# Patient Record
Sex: Male | Born: 1955 | Race: White | Hispanic: No | Marital: Single
Health system: Southern US, Community
[De-identification: ages and names within clinical notes are randomized; demographics above are authoritative.]

---

## 2012-04-17 ENCOUNTER — Observation Stay: Payer: Self-pay | Admitting: Internal Medicine

## 2012-04-17 LAB — CBC
HCT: 43.4 % (ref 40.0–52.0)
HGB: 14.6 g/dL (ref 13.0–18.0)
MCH: 30.8 pg (ref 26.0–34.0)
MCHC: 33.7 g/dL (ref 32.0–36.0)
MCV: 91 fL (ref 80–100)
Platelet: 546 10*3/uL — ABNORMAL HIGH (ref 150–440)
RDW: 14 % (ref 11.5–14.5)
WBC: 12.3 10*3/uL — ABNORMAL HIGH (ref 3.8–10.6)

## 2012-04-17 LAB — COMPREHENSIVE METABOLIC PANEL
Albumin: 5 g/dL (ref 3.4–5.0)
Alkaline Phosphatase: 117 U/L (ref 50–136)
Anion Gap: 3 — ABNORMAL LOW (ref 7–16)
BUN: 20 mg/dL — ABNORMAL HIGH (ref 7–18)
Bilirubin,Total: 0.5 mg/dL (ref 0.2–1.0)
Chloride: 109 mmol/L — ABNORMAL HIGH (ref 98–107)
Creatinine: 0.94 mg/dL (ref 0.60–1.30)
EGFR (African American): >60
EGFR (Non-African Amer.): >60
Glucose: 137 mg/dL — ABNORMAL HIGH (ref 65–99)
Osmolality: 284 (ref 275–301)
SGPT (ALT): 37 U/L (ref 12–78)
Sodium: 140 mmol/L (ref 136–145)

## 2012-04-18 LAB — CBC WITH DIFFERENTIAL/PLATELET
Basophil #: 0.1 10*3/uL (ref 0.0–0.1)
Basophil %: 1.2 %
HCT: 36.7 % — ABNORMAL LOW (ref 40.0–52.0)
HGB: 12.4 g/dL — ABNORMAL LOW (ref 13.0–18.0)
Lymphocyte %: 29.8 %
MCH: 30.8 pg (ref 26.0–34.0)
MCHC: 33.7 g/dL (ref 32.0–36.0)
Monocyte #: 1.1 x10 3/mm — ABNORMAL HIGH (ref 0.2–1.0)
Monocyte %: 9.3 %
Neutrophil #: 6.7 10*3/uL — ABNORMAL HIGH (ref 1.4–6.5)
Neutrophil %: 55.9 %
RBC: 4.01 10*6/uL — ABNORMAL LOW (ref 4.40–5.90)
RDW: 13.8 % (ref 11.5–14.5)

## 2012-04-18 LAB — BASIC METABOLIC PANEL
Anion Gap: 4 — ABNORMAL LOW (ref 7–16)
BUN: 12 mg/dL (ref 7–18)
Calcium, Total: 8.3 mg/dL — ABNORMAL LOW (ref 8.5–10.1)
EGFR (African American): >60
EGFR (Non-African Amer.): >60
Glucose: 83 mg/dL (ref 65–99)
Potassium: 3.8 mmol/L (ref 3.5–5.1)

## 2012-04-18 LAB — PROTEIN, TOTAL: Total Protein: 6.5 g/dL (ref 6.4–8.2)

## 2012-04-20 LAB — PROT IMMUNOELECTROPHORES(ARMC)

## 2014-05-05 NOTE — H&P (Signed)
PATIENT NAME:  Jacob [name], Jacob [name] MR#:  [id number] DATE OF BIRTH:  02-06-1955  DATE OF ADMISSION:  04/17/2012  PRIMARY CARE PHYSICIAN: None.  PSYCHIATRIST: Dr. Aron Baba in [place].   CHIEF COMPLAINT: Dysphagia.   HISTORY OF PRESENT ILLNESS: This is a 59 year old man who I saw in the endoscopy suite post procedure. He states that last night he was drinking Sprite. He went to sleep. This morning around 5:30 instead of drinking the Sprite ended up drinking pool cleaner instead. He states that this was not intentional. He is having dysphagia with swallowing, 9 out of 10 in intensity. Dr. Niel Hummer took him to the endoscopy suite and found nonbleeding erosive gastropathy. Mild esophageal edema could be grade 1 caustic injury or chronic GERD. Also, duodenal erosions without bleeding were seen. Dr. Niel Hummer recommended observing another 24 hours in the hospital, liquid diet and PPI, and hospitalist services were contacted for further evaluation.   PAST MEDICAL HISTORY: Depression.   PAST SURGICAL HISTORY: None.   ALLERGIES: No known drug allergies.   MEDICATIONS: The patient takes Dexedrine 10 mg 3 times a day.   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 He smokes 1 pack per day. Rare alcohol use. No drug use.   FAMILY HISTORY: Father died of dementia, had DVT. Mother, unclear medical history.  REVIEW OF SYSTEMS:   CONSTITUTIONAL: No fever, chills or sweats. No weight gain. No weight loss.  EYES: He does wear reading glasses.  EARS, NOSE, MOUTH AND THROAT: Positive for sore throat and dysphagia. No hearing loss. No runny nose.  CARDIOVASCULAR: No chest pain. No palpitations.  RESPIRATORY: No shortness of breath. No cough. No sputum. No hemoptysis.  GASTROINTESTINAL: No nausea. No vomiting. No abdominal pain. No diarrhea. No constipation. No bright red blood per rectum. No melena.   GENITOURINARY: No burning on urination. No hematuria.  MUSCULOSKELETAL: No  joint pain or muscle pain.  INTEGUMENT: No rashes or eruptions.  NEUROLOGIC: No fainting or blackouts.  PSYCHIATRIC: Positive for depression.  ENDOCRINE: No thyroid problems.  HEMATOLOGIC AND LYMPHATIC: No anemia. No easy bruising or bleeding   PHYSICAL EXAMINATION:  VITAL SIGNS: Temperature 98.4, pulse 68, blood pressure 130/83, pulse oximetry 100% on room air, respirations 17.  GENERAL: No respiratory distress.  EYES: Conjunctivae and lids normal. Pupils equal, round and reactive to light. Extraocular muscles intact. No nystagmus.  EARS, NOSE, MOUTH AND THROAT: Tympanic membranes: No erythema. Nasal mucosa: No erythema. Throat: No erythema, no exudate seen. Lips and gums: No lesions.  NECK: No JVD. No bruits. No lymphadenopathy. No thyromegaly. No thyroid nodules palpated.  LUNGS: Clear to auscultation. No use of accessory muscles to breathe. No rhonchi, rales or wheeze heard.  CARDIOVASCULAR: S1, S2 normal. No gallops, rubs or murmurs heard. Carotid upstroke 2+ bilaterally, no bruits. Dorsalis pedis pulses 2+ bilaterally. No edema of the lower extremity.  ABDOMEN: Soft, nontender. No organomegaly/splenomegaly. Normoactive bowel sounds. No masses felt.  LYMPHATIC: No lymph nodes in the neck.  MUSCULOSKELETAL: No clubbing, edema or cyanosis.  SKIN: No rashes or ulcers seen.  NEUROLOGIC: Cranial nerves II through XII grossly intact. Deep tendon reflexes are 2+ bilateral lower extremities.  PSYCHIATRIC: The patient is oriented to person, place and time. Very tangential with answers. Poor eye contact.   LABORATORY AND RADIOLOGICAL DATA: Chest x-ray showed mild hyperinflation consistent with COPD. Soft tissue neck: No acute abnormality. Glucose 137, BUN 20, creatinine 0.94, sodium 140, potassium 4.0, chloride 109, CO2 of 28, calcium 9.4.  Liver function tests: AST slightly elevated at 40, total protein slightly elevated at 9.1. White blood cell count 12.3, hemoglobin and hematocrit 14.6 and 43.4,  platelet count 546. Lipase 268.   ASSESSMENT AND PLAN:  1.  Dysphagia secondary to ingestion of pool cleaner: Found to have esophagitis, duodenitis and gastritis. Gastroenterology recommended 24-hour observation with intravenous Protonix, intravenous fluids, pain control, liquid diet. Dr. Niel HummerIftikhar will evaluate daily to see how he is doing.  2.  Depression: The patient ingested pool cleaner, unclear cause. The patient states that he is not suicidal. The patient is very tangential with answers. I will get a psychiatric consultation to evaluate further.  3.  Tobacco abuse: Smoking cessation counseling done 3 minutes by me. Nicotine patch as needed.  4.  Impaired fasting glucose: Will check a hemoglobin A1c.  5.  Elevated total protein: Likely secondary to dehydration but will give intravenous fluids. Recheck total protein tomorrow morning and send off a serum protein electrophoresis.   TIME SPENT ON ADMISSION: 55 minutes.   ____________________________ Herschell Dimesichard J. Renae GlossWieting, MD rjw:jm D: 04/17/2012 18:25:22 ET T: 04/17/2012 20:06:35 ET JOB#: [id number]  cc: Herschell Dimesichard J. Renae GlossWieting, MD, <Dictator> Dr. Aron BabaWojdynska Salley ScarletICHARD J Shantrell Placzek MD ELECTRONICALLY SIGNED 04/19/2012 15:11

## 2014-05-05 NOTE — Consult Note (Signed)
Brief Consult Note: Diagnosis: depression nos.   Patient was seen by consultant.   Consult note dictated.   Orders entered.   Comments: Psychiatry: Patient seen. Chart reviewed. Patient denies any suicidal ideation or that behavior was in any way intentional or represented suicide attempt. Does admit to depressive symptoms but denies being hopeless or having suicide hx or SA. Patient counceled on importance of following up with outpt treatment which he already has an appointment for. No indication for commitment or psych hospital stay. DCed the suicide precautions.  Electronic Signatures: Audery Amellapacs, Idaliz Tinkle T (MD)  (Signed 06-Apr-14 17:41)  Authored: Brief Consult Note   Last Updated: 06-Apr-14 17:41 by Audery Amellapacs, Shealyn Sean T (MD)

## 2014-05-05 NOTE — Consult Note (Signed)
Brief Consult Note: Diagnosis: Suspected caustic ingestion (accidental).   Patient was seen by consultant.   Consult note dictated.   Comments: Please see dictated consult. Thanks.  Electronic Signatures: Lurline DelIftikhar, Suliman Termini (MD)  (Signed 05-Apr-14 15:37)  Authored: Brief Consult Note   Last Updated: 05-Apr-14 15:37 by Lurline DelIftikhar, Velora Horstman (MD)

## 2014-05-05 NOTE — Discharge Summary (Signed)
PATIENT NAME:  Jacob [name], Jacob [name] MR#:  [id number] DATE OF BIRTH:  11-Mar-1955  DATE OF ADMISSION:  04/17/2012 DATE OF DISCHARGE:  04/18/2012 (Likely, pending psychiatric evaluation).   DISCHARGE DIAGNOSES:  1.  Dysphagia secondary to ingestion of caustic substance, the pool liquid.  2.  History of depression.   HOSPITAL COURSE:  The patient is a 59 year old male brought in and admitted for dysphagia.  The patient drank the pool liquid about 5:30 a.m. yesterday on April 5th, thinking that it was Sprite.  The patient had only one swallow and he had significant dysphagia.  He was brought into the hospital.  The patient had an EGD.  EGD showed no erythema, no ulcer.  The patient's mucosa appeared somewhat edematous, loss of normal (vascular  pattern secondary to some coating of esophagus due to recently ingested material.  The patient had a few localizing small nonbleeding erosions present in gastric antrum upper body.  Impression of EDG results was probable mild esophageal edema without significant erythema or ulcer consistent with grade 1 caustic injury or chronic GERD is a possibility.  The patient did receive IV fluids and started on viscous lidocaine along with PPIs and started on clear liquids yesterday after the procedure.  The patient actually felt better today, has no trouble swallowing and he is on full liquids.  The patient was seen by Dr. Niel HummerIftikhar who suggested that the patient is to be on full liquids for another 24 hours and follow with Dr Niel HummerIftikhar on Tuesday, that is April 8th at 3:00 p.m. regarding followup.  The patient is on 1:1 precaution because of the nature of presentation with caustic ingestion, but the patient said it was accidental and denies any suicidal or homicidal ideation.  We have asked for psych to see the patient.  If psychiatry clears the patient, he will be discharged home.  The patient will follow with Dr Unk PintoIfitkhar and also continue PPIs and full liquids.  The patient needs to be  on full liquids for another 24 hours.   DISCHARGE MEDICATIONS:  Protonix 40 mg p.o. b.i.[name].   Full liquid diet.   The patient will follow up with Dr. Niel HummerIftikhar on Tuesday.   DISCHARGE DIAGNOSES:  1.  Dysphagia secondary to ingestion of caustic liquid, which has resolved.  2.  Depression.    LABORATORY DATA:  The patient's white count was slightly up at 12.3 on admission but hemoglobin 14.6, hematocrit 43.4.  Electrolytes were stable, creatinine 0.94, BUN 20 on  admission.  Soft tissue of the neck showed no acute abnormalities.  He had a chest x-ray which showed mild hyperinflation consistent with COPD.  Today WBC 11.9.   Time spent on discharge of patient more than 30 minutes.  He will be discharged home once  psychiatry clears the patient.    ____________________________ Katha HammingSnehalatha Duru Reiger, MD [name]: 04/18/2012 12:43:03 ET T: 04/18/2012 18:16:09 ET JOB#: [id number]  cc: Katha HammingSnehalatha Lahoma Constantin, MD, <Dictator> Lurline DelShaukat Iftikhar, MD Katha HammingSNEHALATHA Rori Goar MD ELECTRONICALLY SIGNED 05/02/2012 20:07

## 2014-05-05 NOTE — Consult Note (Signed)
Chief Complaint:  Subjective/Chief Complaint Feels better with only minimal pain on swallowing. Tolerating liquids well. No SOB, abdominal pain or chest pain. No subcutaneous emphysema. No fever. Vitals stable. Leucocytosis improved.  Impressio: Accidental caustic ingestion. EGD with grade 1 esophageal injury (? edema). No evidence of ulceration etc.  Recommendations: Full liquid diet. If patient is discharged today, should stay on liquids for another 24 hours and follow up with me on Tuesday at 3pm. Discussed with Dr. Luberta MutterKonidena. DC on Protonix. If patient stays in the hospital, will follow.   Electronic Signatures: Lurline DelIftikhar, Terria Deschepper (MD)  (Signed 06-Apr-14 09:33)  Authored: Chief Complaint   Last Updated: 06-Apr-14 09:33 by Lurline DelIftikhar, Akeel Reffner (MD)

## 2014-05-05 NOTE — Consult Note (Signed)
DATE OF BIRTH:  28-Nov-1955  DATE OF CONSULTATION:  04/17/2012  CONSULTING PHYSICIAN:  Lurline Del, MD  REASON FOR CONSULTATION:  Accident ingestion of caustic material, dysphagia.   HISTORY OF PRESENT ILLNESS: This is a 59 year old male with history of ADD. Apparently patient mistakenly swallowed a small amount of a pool algae cleaner, according to him. It tasted like Clorox. He swallowed maybe about half an ounce, according to him, and he spit out the rest. There was no suicidal attempt or ideation. He did not have any immediate problem, although later on he started to have discomfort in the back of the throat only on swallowing. He denies any shortness of breath, cough, chest pain, fever, chills or any other significant symptoms. The algae site was evaluated by Dr. Enedina Finner in [facility], and according to Dr. Enedina Finner, it contained a strong alkaline substance. Poison Control was apparently contacted, and an upper GI endoscopy was recommended to assess the extent of esophageal injury. As mentioned above, the patient denies any abdominal pain, chest pain, etc. The patient was evaluated and just underwent an upper GI endoscopy. Please see the complete EGD report for description.   PAST MEDICAL HISTORY: Significant for ADD, according to him, patient has had tonsillectomy in the past.  Past medical history is otherwise quite insignificant.   ALLERGIES:  None.   SOCIAL HISTORY:  He smokes about a pack a day.   FAMILY HISTORY:  Unremarkable.   REVIEW OF SYSTEMS: Grossly negative, except for what is mentioned in the History of Present Illness.   PHYSICAL EXAMINATION: GENERAL:  A fairly well-built male who does not appear to be in any acute distress. Quite awake and alert.  VITAL SIGNS:  His vitals are fairly stable, and he has been afebrile.  NECK:  Neck veins are flat. No subcutaneous emphysema was noted.  LUNGS:  Clear to auscultation bilaterally, with good air entry and no added sounds.   CARDIOVASCULAR EXAM: Regular rate and rhythm. No gallops or murmurs. No subcutaneous emphysema was noted on palpation of the chest wall as well.  ABDOMEN:  Soft and benign, flat, nontender, nondistended. No rebound or guarding was noted.  NEUROLOGICAL:  Examination appears to be unremarkable.   LABS:  His electrolytes are unremarkable. BUN is 20, creatinine is normal at 0.94. Serum lipase is normal. White cell count is slightly elevated at 12.3. The rest of the CBC is within normal limits. Soft tissue neck x-ray is negative.   ASSESSMENT AND PLAN:  Patient with accidental ingestion of a small amount of caustic substance. He is complaining of severe throat discomfort on swallowing. The patient has no shortness of breath, chest pain, abdominal pain, nausea, vomiting, or any other significant GI symptoms. An urgent upper GI endoscopy was planned and done. The patient does have some chronic GI changes such as gastric and duodenal erosions, which are probably not related to caustic ingestion. Esophagus appeared somewhat abnormal because of recent ingestion of Pepsi, but esophageal mucosal edema cannot be completely ruled out. No significant erythema or ulcerations were noted. Mild erythema was noted in the back of the throat. Due to these findings and probable grade 1 caustic injury to the esophagus. I would recommend observing him for 24 to 48 hours in the hospital. The patient can have only clear liquid diet. Start him on Protonix 40 mg IV b.i.d. These suggestions were discussed with Dr. Enedina Finner, who will get in touch with hospitalist service for admission. Repeat CBC in the morning. Further recommendations  depending on the patient's hospital course    ____________________________ Lurline DelShaukat Racquel Arkin, MD si:mr D: 04/17/2012 15:42:03 ET T: 04/17/2012 16:48:23 ET JOB#: [id number]  cc: Lurline DelShaukat Kedrick Mcnamee, MD, <Dictator> Lurline DelSHAUKAT Summerlynn Glauser MD ELECTRONICALLY SIGNED 04/18/2012 17:28

## 2014-05-05 NOTE — Consult Note (Signed)
PATIENT NAME:  Jacob [name], Jacob [name] MR#:  [id number] DATE OF BIRTH:  [birth year]  DATE OF CONSULTATION:  04/18/2012  CONSULTING PHYSICIAN:  Audery Amel, MD  IDENTIFYING INFORMATION AND REASON FOR CONSULT: A 59 year old man admitted to the hospital after ingestion of a caustic substance.  Consult is because of possibility of suicidal behavior.   HISTORY OF PRESENT ILLNESS: Information is obtained from the patient and the chart. The patient yesterday presented to the hospital after having ingested a caustic substance which it appears was some kind of swimming poor cleaner product. The patient was complaining of burning in his throat. The patient's story throughout this episode has been that he was sleeping and got up probably sleepwalking or partially sleepwalking and accidentally drank some of this bottle of toxic substance. He reports that he only drank about mouthful of it before feeling what it was in the back of his throat, spitting it out and calling his daughter, who was in the house. He denies that he in any way did it intentionally. He tells me today that he was asleep, and the first thing he remembers is suddenly coming to with this caustic substance in his mouth. The substance was apparently stored underneath the sink at the patient's daughter's home. The patient was staying at his daughter's house where he had come to help her out with some household work for a couple of days. The patient admits that his mood has been dysphoric and down and has been like that for probably most of a year or so. He says that overall it has probably been a little bit better than it used to be, and he is still able to enjoy normal things in his life. He denies any difficulty with sleep or appetite.  He denies that he has been having any suicidal ideation. He denies that he has had any sense of hopelessness or helplessness. He was taking an antidepressant, Celexa or Cymbalta, I am not sure which from his history, which he  stopped about 3 weeks ago. He also reports that in the past he has taken Prozac and Paxil. He says he does not think that the Cymbalta was of any help to him, so he stopped it. He says that he does still have an appointment to see a mental health provider coming up in a few weeks. He completely denies any recent suicidal ideation.   PAST PSYCHIATRIC HISTORY: He reports that he has had problems with depression in the past but has never been hospitalized for it. He has been tried on a couple of antidepressants, including Prozac and Paxil as well as Cymbalta, possibly Celexa, but he says none of them have clearly been of any benefit. He denies ever having tried to kill himself in the past, denies any history of violence.   SOCIAL HISTORY: The patient lives with his brother. The patient is not currently working, having been laid off from his job about a year ago. He has been living on his inheritance. His father passed away somewhere between a year and 2 years ago, it seems, and left the patient some money which he has been living on. The death of his father was also a major emotional blow, and the patient feels like that has also set off some of his depression. He has 3 adult children, 1 of them a daughter here in [place] who he had come to stay with for a couple of days.   PAST MEDICAL HISTORY: He had some burning to  his GI tract from this substance but looks like he will make a full recovery. He claims that he does not have any other ongoing medical problems.   SUBSTANCE ABUSE HISTORY: He says he drinks 1 or 2 beers every few days but denies that he has been increasing the amount, and he denies that it has been a problem for him. He denies that he was drinking when this happened. He denies any past history of substance abuse.   REVIEW OF SYSTEMS: Currently feels frustrated at having to wait for the psychiatry evaluation. He denies feeling severely depressed. He denies hallucinations or delusions, denies  suicidal or homicidal ideation. He still has some soreness in his throat but has been able to get down some liquids today.   MENTAL STATUS EXAM: A somewhat disheveled man interviewed in a hospital bed. He was mostly cooperative with the interview, although he did seem frustrated and made a lot of protests and complaints that he thought it was silly that he was being evaluated. Eye contact was intermittent. Psychomotor activity fidgety. Mood somewhat irritable and dysphoric. Not tearful. Thoughts appear generally lucid. No sign of delusional thinking. Denies auditory or visual hallucinations. Denies any suicidal or homicidal ideation. Can name several things in life that are positive for him that he is looking forward to. Baseline intelligence appears to be normal. Current judgment and insight probably back to normal baseline. Memory, short term and long-term, adequate.   LABORATORY DATA: Unfortunately, no alcohol or drug level was done at the time of admission, so I do not know whether he was intoxicated at the time.   ASSESSMENT: A 59 year old man who swallowed a toxic substance but then immediately sought help for it, did not swallow enough to do himself any serious damage. He admits that the whole story sounds odd, but he indicates that he thinks that he was sleepwalking through the whole thing. He does have a history of depression and is probably still having a depression going on but completely denies any suicidal ideation. I tried to speak to his daughter on the phone but was not able to reach her, but I did get to overhear both sides of a phone conversation the patient had with his daughter in which she clearly said that she did not think he was suicidal and did not think that he was dangerous. At this point, we do not have any reason to think strongly that the patient is suicidal or acutely dangerous to himself. He does not meet commitment criteria. He does need to have mental health follow-up.    TREATMENT PLAN: I educated the patient about the nature of major depression, and the importance of getting it treated, and the risks of not doing so. I strongly encouraged him to follow up with his mental health provider after leaving the hospital. I explained how medication or therapy could be helpful for him.  I encouraged him to please reach out and contact someone if he were to ever have any thoughts about harming himself. I ascertained with him that he has no guns at home, and he does not think his daughter does either. At this point, no indication for psychiatric hospitalization. No reason to start new medicine. Recommend discharge from the hospital.   DIAGNOSIS, PRINCIPAL AND PRIMARY:  AXIS I: Major depression, mild-to-moderate, recurrent.   SECONDARY DIAGNOSES: AXIS I: No further.   AXIS II: Deferred.   AXIS III: Burns, status post ingestion of an alkali cleaner.   AXIS IV: Moderate,  chronic stress from joblessness and death of his father, which he is still mourning.   AXIS V: Functioning at time of evaluation 62.    ____________________________ Audery AmelJohn T. Kaoir Loree, MD jtc:cb D: 04/18/2012 17:55:29 ET T: 04/18/2012 20:01:26 ET JOB#: [id number]  cc: Audery AmelJohn T. Elyana Grabski, MD, <Dictator> Audery AmelJOHN T Haset Oaxaca MD ELECTRONICALLY SIGNED 04/18/2012 21:52

## 2014-09-30 IMAGING — CR NECK SOFT TISSUES - 1+ VIEW
1 series · 2 of 2 positions shown · non-contrast
Comparison: none

REASON FOR EXAM: dysphagia alkali ingestion
COMMENTS:

[Series 1: w soft tissue neck lat · 0.14mm/px · 2 of 2 slices shown]
[im 1/2]
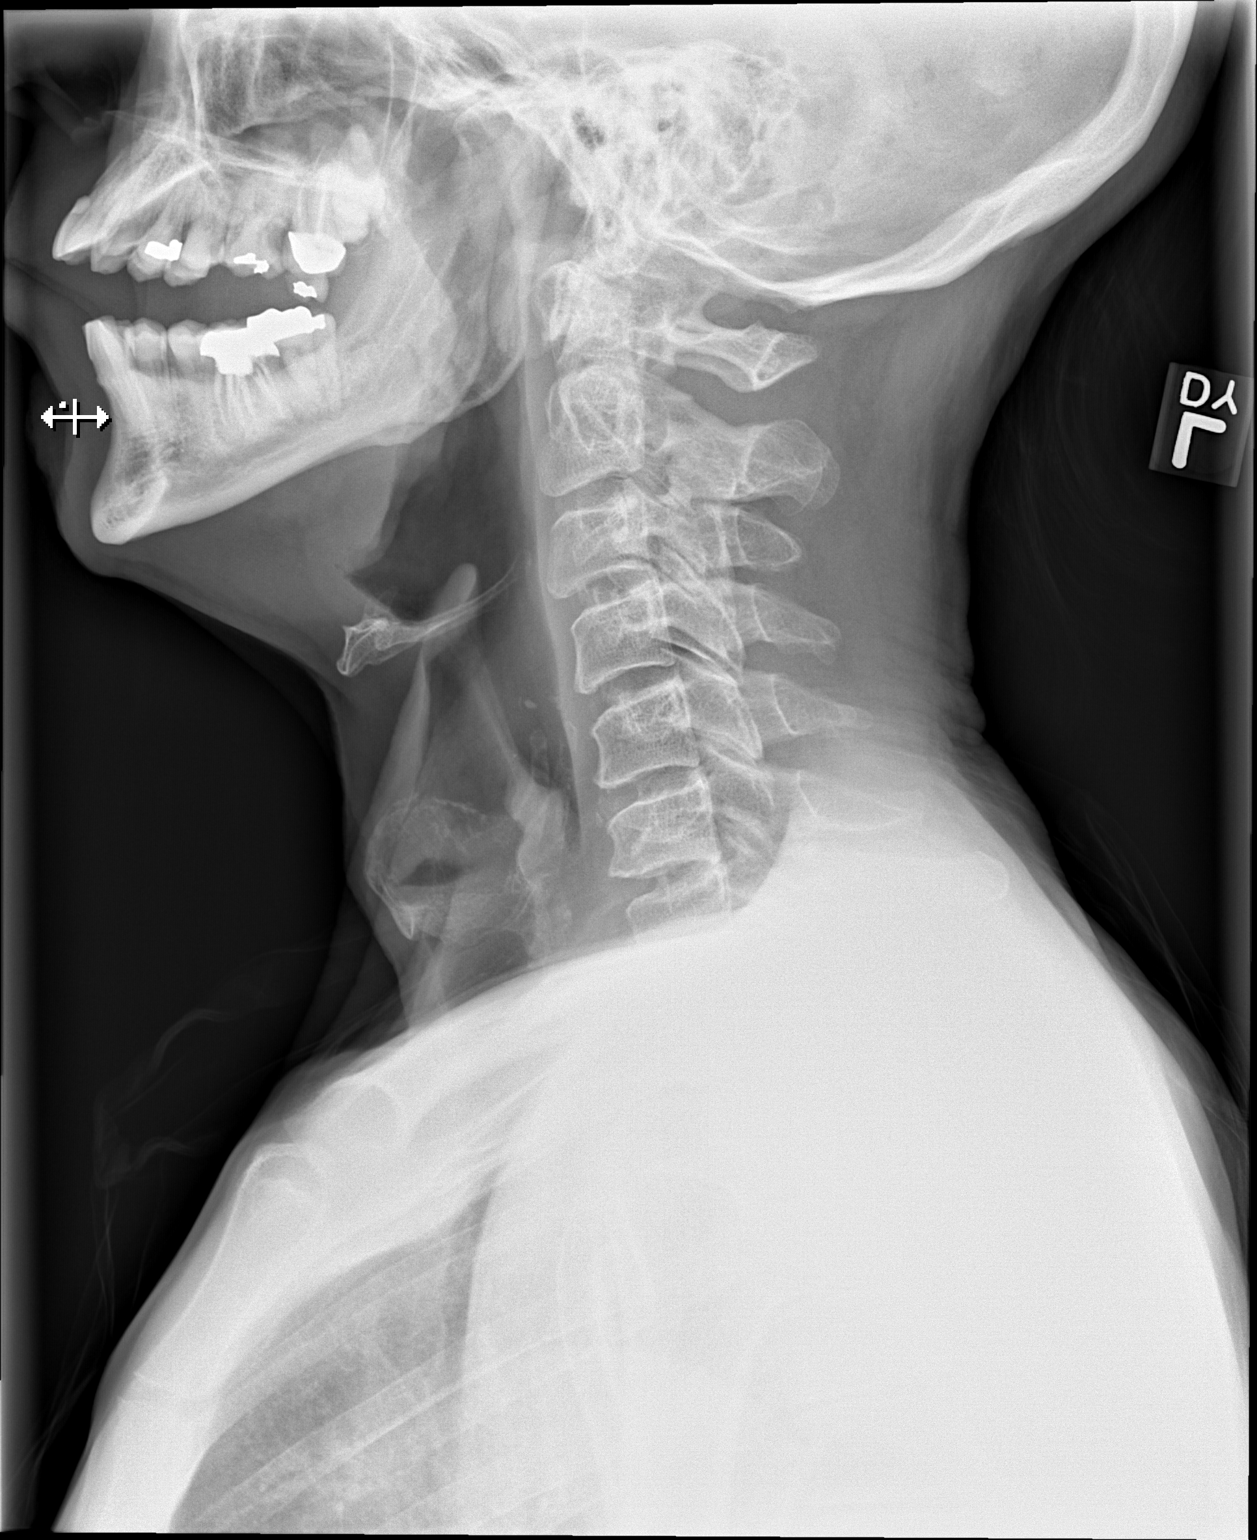
[im 2/2]
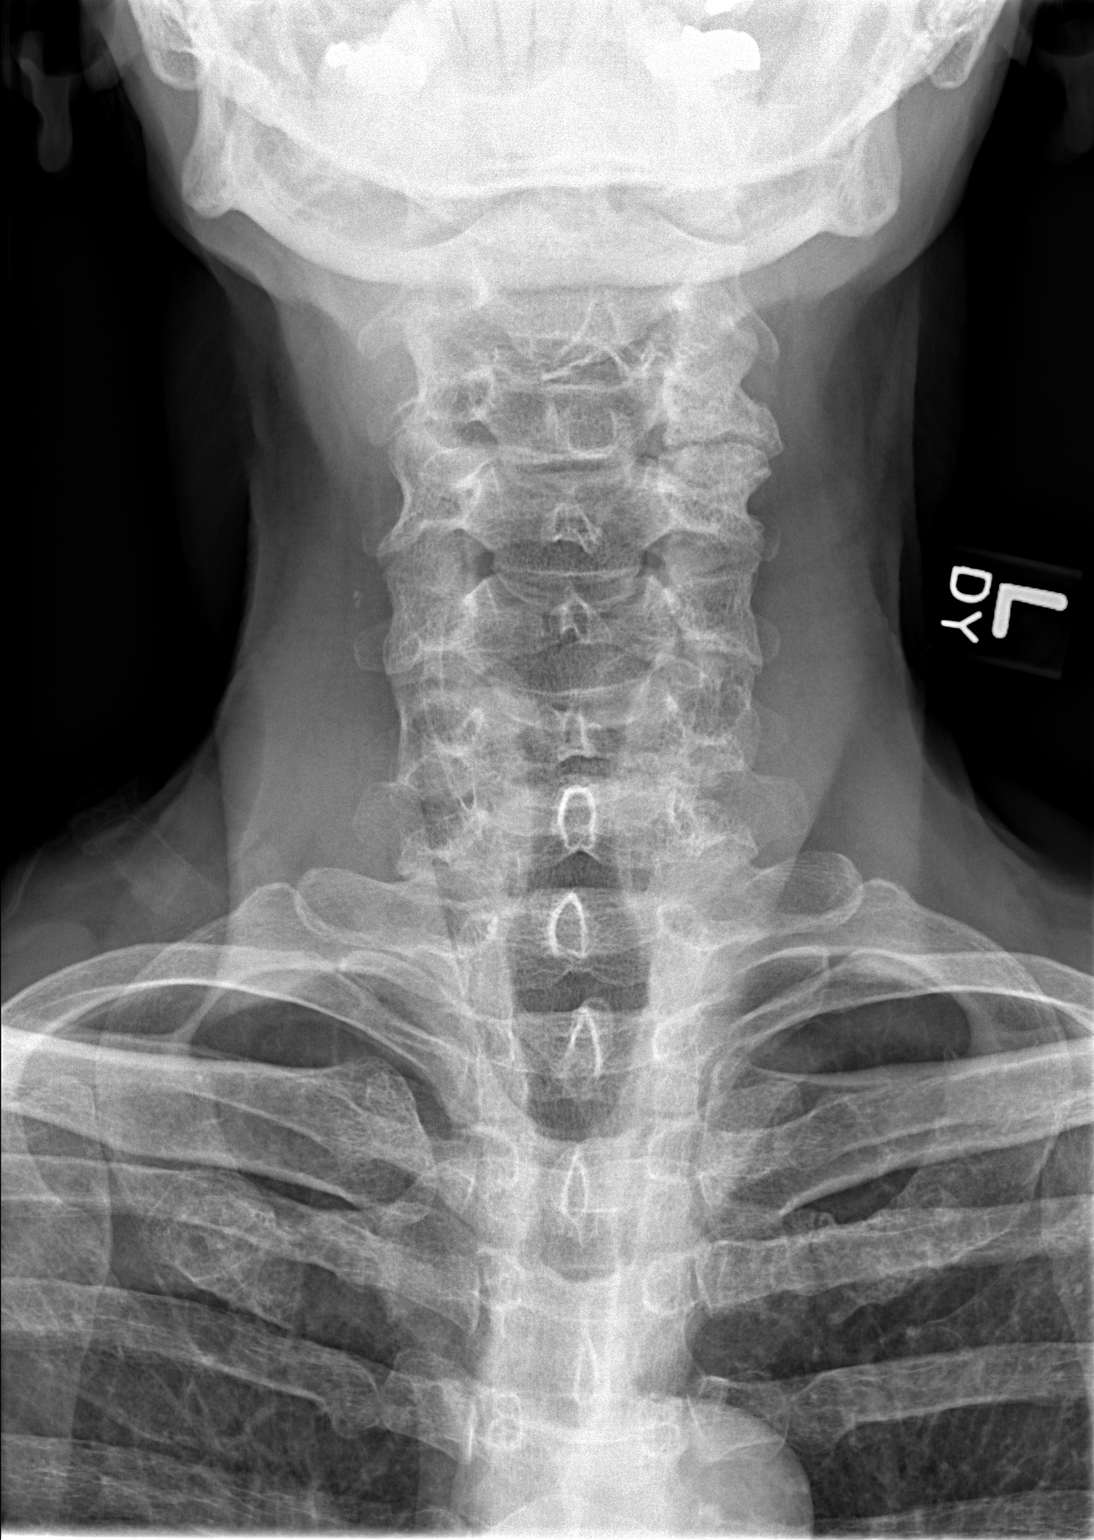

[2 of 2 positions shown; findings below may reference images not displayed]

PROCEDURE:     DXR - DXR SOFT TISSUE NECK  - April 17, 2012  [DATE]

RESULT:     The cervical vertebral bodies are preserved in height. The
prevertebral soft tissue spaces appear normal. The cervical airway exhibits
no acute abnormality. No abnormal gas collections are demonstrated in the
soft tissues of the neck. There is no evidence of pneumomediastinum in the
visualized portion of the thorax.
IMPRESSION: There is no acute abnormality demonstrated of the cervical
airway.

[REDACTED]

## 2018-03-14 DEATH — deceased
